# Patient Record
Sex: Female | Born: 1990 | Hispanic: Yes | Marital: Married
Health system: Southern US, Community
[De-identification: ages and names within clinical notes are randomized; demographics above are authoritative.]

---
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
## 2019-03-29 ENCOUNTER — Other Ambulatory Visit: Payer: Self-pay | Admitting: Family Medicine

## 2019-03-29 DIAGNOSIS — N939 Abnormal uterine and vaginal bleeding, unspecified: Secondary | ICD-10-CM

## 2019-04-02 ENCOUNTER — Other Ambulatory Visit: Payer: Self-pay

## 2019-04-02 ENCOUNTER — Ambulatory Visit
Admission: RE | Admit: 2019-04-02 | Discharge: 2019-04-02 | Disposition: A | Discharge status: Home / Self Care | Payer: Medicaid Other | Source: Ambulatory Visit | Attending: Family Medicine | Admitting: Family Medicine

## 2019-04-02 DIAGNOSIS — N939 Abnormal uterine and vaginal bleeding, unspecified: Secondary | ICD-10-CM

## 2019-05-22 ENCOUNTER — Other Ambulatory Visit: Payer: Self-pay

## 2019-05-22 ENCOUNTER — Ambulatory Visit: Payer: Medicaid Other | Attending: Internal Medicine

## 2019-05-22 DIAGNOSIS — Z23 Encounter for immunization: Secondary | ICD-10-CM

## 2019-05-22 NOTE — Progress Notes (Signed)
   Covid-19 Vaccination Clinic  Name:  Jasmine Hanson    MRN: [id number] DOB: 1990-05-01  05/22/2019  [name] was observed post Covid-19 immunization for 15 minutes without incident. She was provided with Vaccine Information Sheet and instruction to access the V-Safe system.   [name] was instructed to call 911 with any severe reactions post vaccine: Marland Kitchen Difficulty breathing  . Swelling of face and throat  . A fast heartbeat  . A bad rash all over body  . Dizziness and weakness   Immunizations Administered    Name Date Dose VIS Date Route   Pfizer COVID-19 Vaccine 05/22/2019 11:41 AM 0.3 mL 03/17/2018 Intramuscular   Manufacturer: [employer], [employer]   Lot: [id number]   NDC: [address]-5

## 2019-06-15 ENCOUNTER — Ambulatory Visit: Payer: Medicaid Other | Attending: Internal Medicine

## 2019-06-15 DIAGNOSIS — Z23 Encounter for immunization: Secondary | ICD-10-CM

## 2019-06-15 NOTE — Progress Notes (Signed)
   Covid-19 Vaccination Clinic  Name:  Zyara Riling    MRN: [id number] DOB: [birth year]  06/15/2019  Ms. Solivan was observed post Covid-19 immunization for 15 minutes without incident. She was provided with Vaccine Information Sheet and instruction to access the V-Safe system.   Ms. Milligan was instructed to call 911 with any severe reactions post vaccine: Marland Kitchen Difficulty breathing  . Swelling of face and throat  . A fast heartbeat  . A bad rash all over body  . Dizziness and weakness   Immunizations Administered    Name Date Dose VIS Date Route   Pfizer COVID-19 Vaccine 06/15/2019  8:28 AM 0.3 mL 03/17/2018 Intramuscular   Manufacturer: [employer], [employer]   Lot: [id number]   NDC: [address]-7

## 2021-04-03 IMAGING — US US PELVIS COMPLETE WITH TRANSVAGINAL
1 series · 13 of 25 positions shown · non-contrast
Comparison: None

CLINICAL DATA: Abnormal uterine bleeding

EXAM:
TRANSABDOMINAL AND TRANSVAGINAL ULTRASOUND OF PELVIS
TECHNIQUE: Both transabdominal and transvaginal ultrasound examinations of the
pelvis were performed. Transabdominal technique was performed for
global imaging of the pelvis including uterus, ovaries, adnexal
regions, and pelvic cul-de-sac. It was necessary to proceed with
endovaginal exam following the transabdominal exam to visualize the
uterus endometrium ovaries.

[Series 1: us pelvis complete with transvaginal · 0.24mm/px · 13 of 50 slices shown]
[im 1/50]
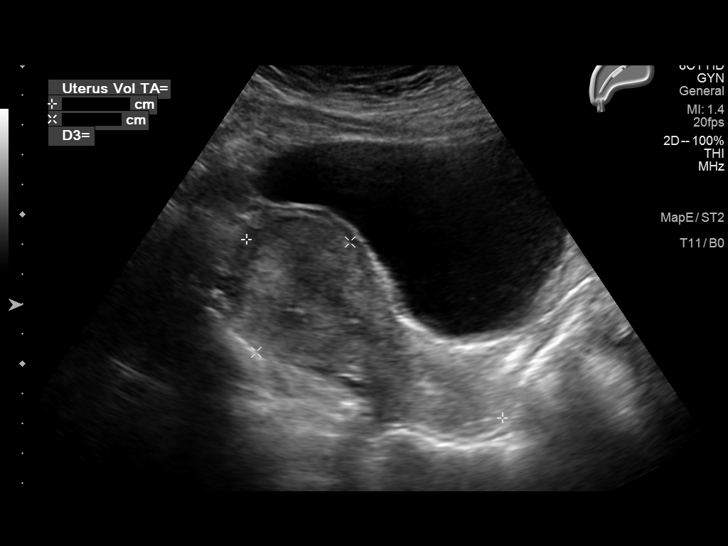
[im 5/50]
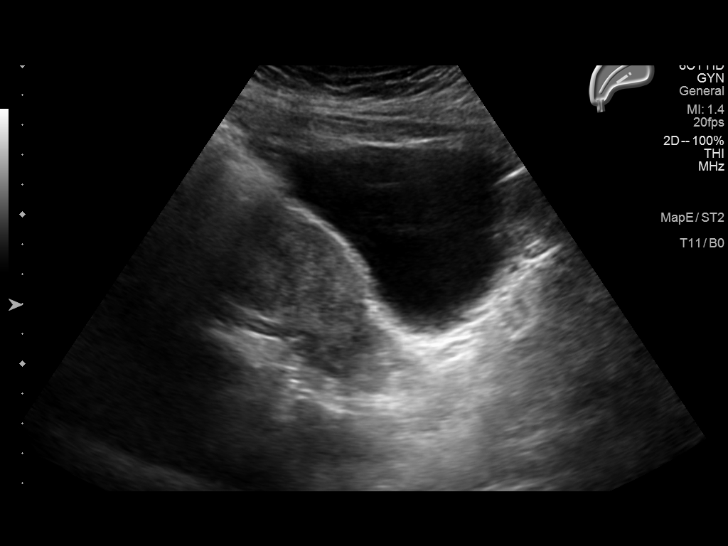
[im 9/50]
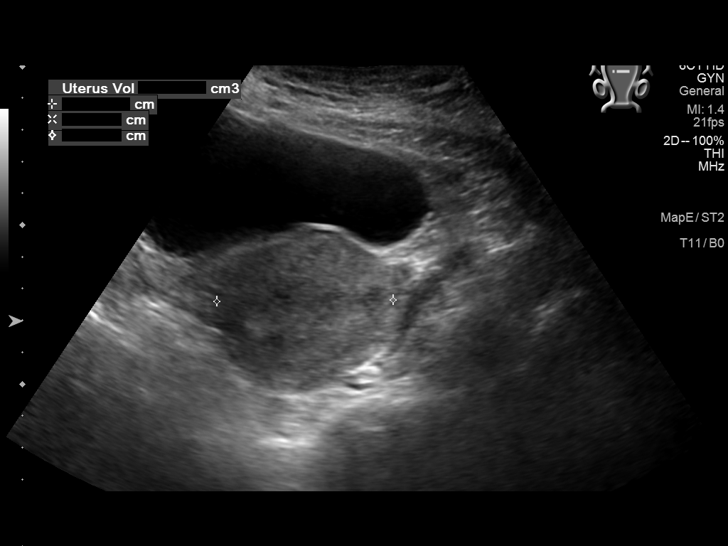
[im 13/50]
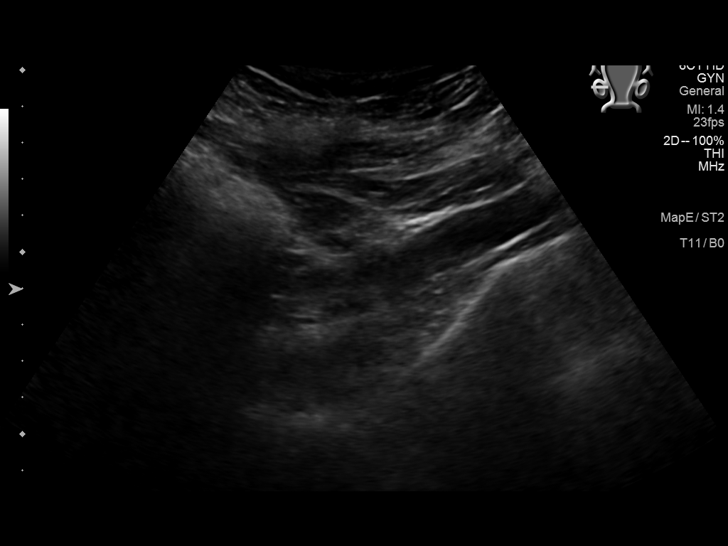
[im 17/50]
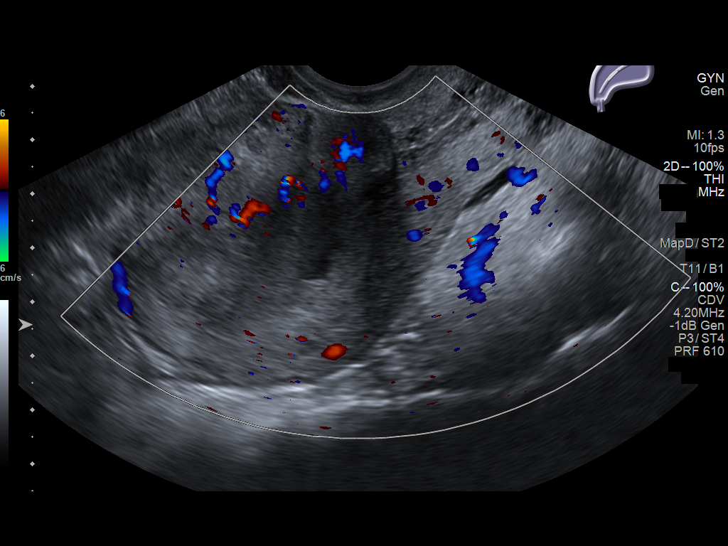
[im 21/50]
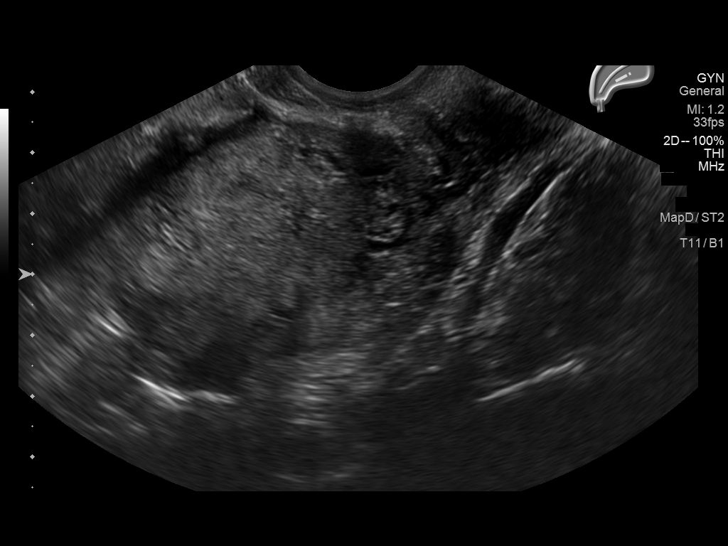
[im 25/50]
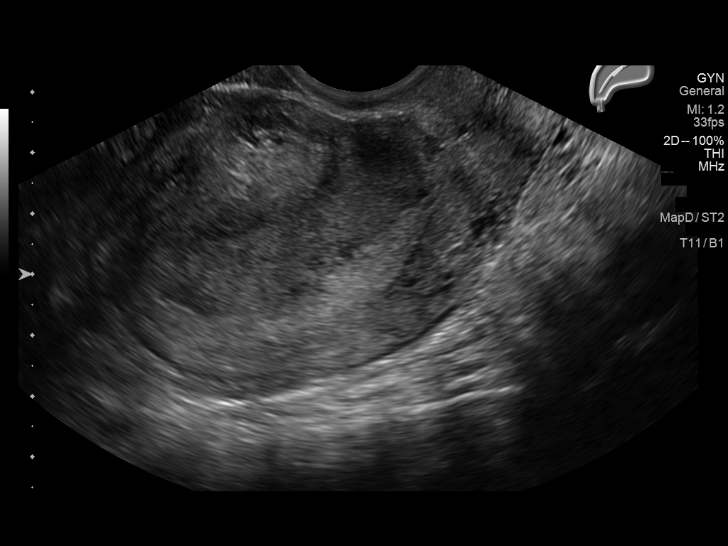
[im 29/50]
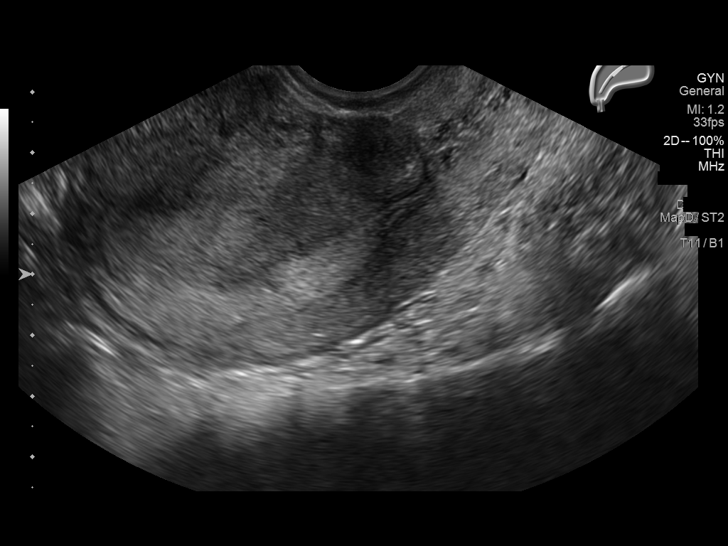
[im 33/50]
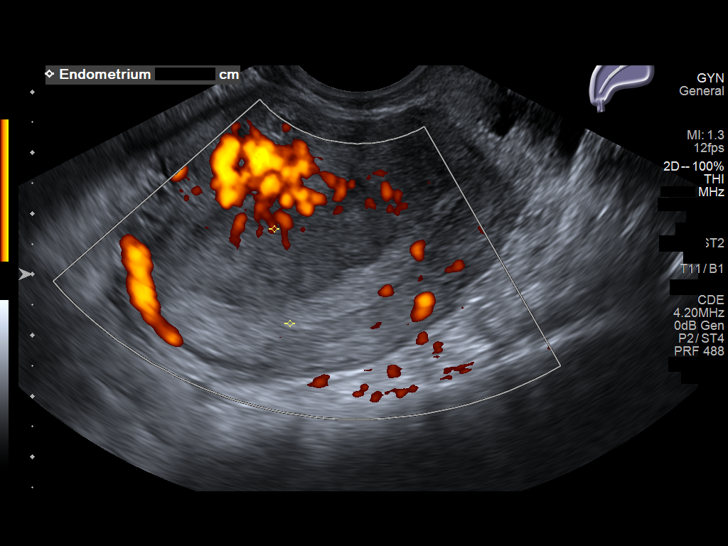
[im 37/50]
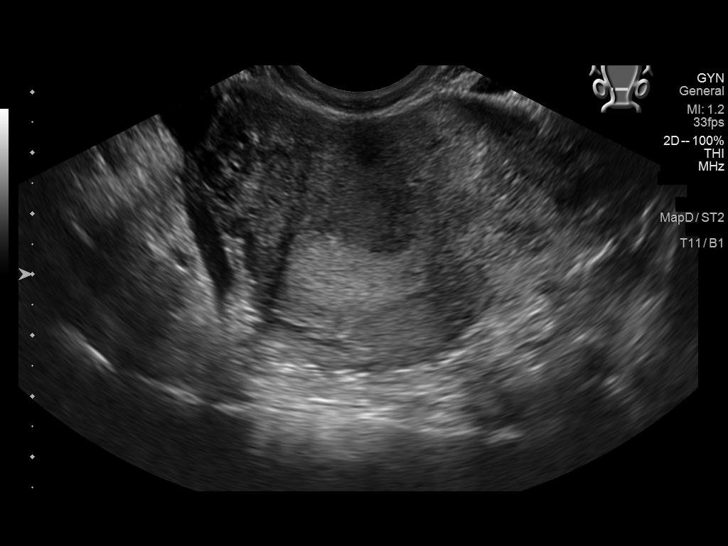
[im 41/50]
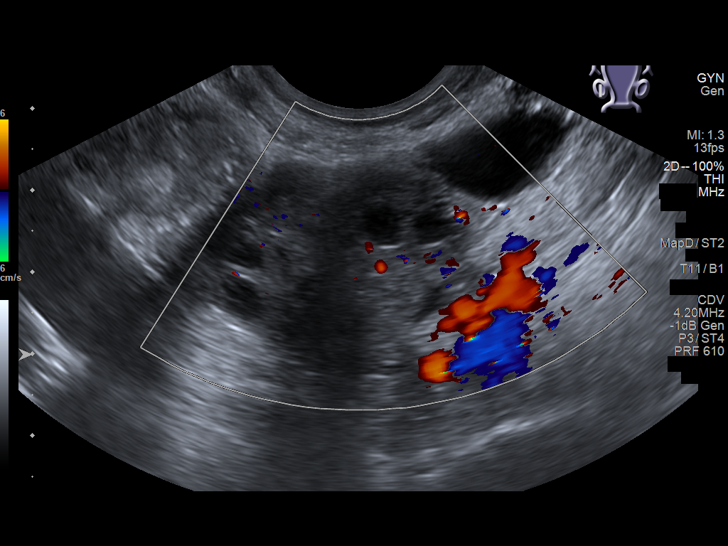
[im 45/50]
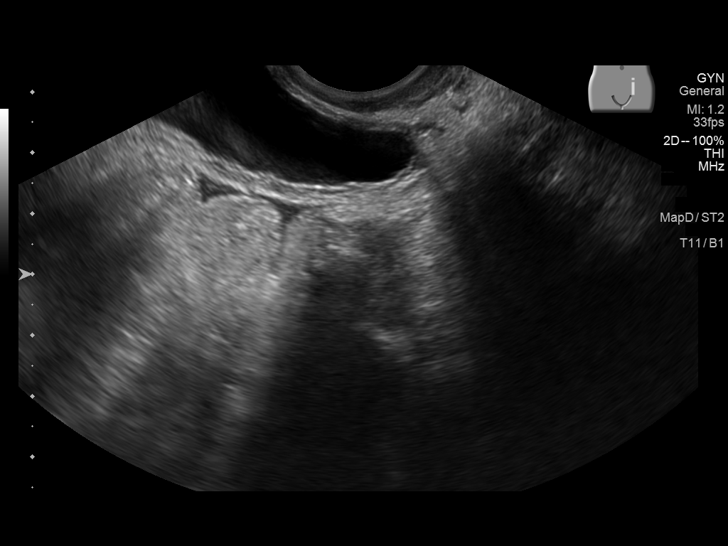
[im 50/50]
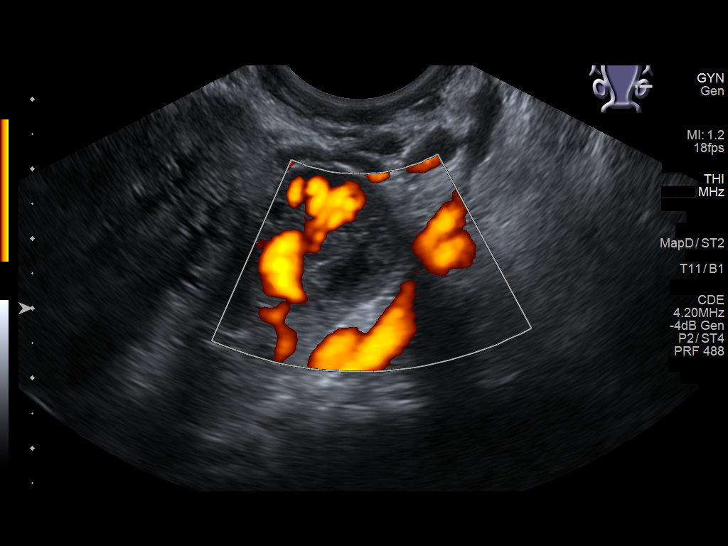

[13 of 25 positions shown; findings below may reference images not displayed]

FINDINGS: Uterus

Measurements: 9.5 x 4.8 x 5.9 cm = volume: 142 mL. Heterogenous
myometrial echotexture with bulky enlargement of the anterior
fundus. No measurable mass in the region.

Endometrium

Thickness: 19.3 mm.  No focal abnormality visualized.

Right ovary

Measurements: 3 x 2.2 x 2.4 cm = volume: 8 mL. 1.6 x 1 x 1.7 cm
paraovarian cyst.

Left ovary

Measurements: 3 x 2.2 x 3.7 cm = volume: 12 mL. Corpus luteal cyst
measuring 1.9 cm.

Other findings

No abnormal free fluid.
IMPRESSION: 1. Heterogenous thickened endometrium. If bleeding remains
unresponsive to hormonal or medical therapy, focal lesion work-up
with sonohysterogram should be considered. Endometrial biopsy should
also be considered in pre-menopausal patients at high risk for
endometrial carcinoma. (Ref: Radiological Reasoning: Algorithmic
Workup of Abnormal Vaginal Bleeding with Endovaginal Sonography and
Sonohysterography. AJR 2772; [id number]:[id number])
2. Heterogenous uterine echotexture with bulky enlargement of the
anterior fundus, findings are nonspecific though raise possibility
of adenomyosis. Pelvic MRI could be considered for further
evaluation.
